# Patient Record
Sex: Male | Born: 1980 | Race: White | Hispanic: Yes | Marital: Married | Smoking: Never smoker
Health system: Southern US, Community
[De-identification: ages and names within clinical notes are randomized; demographics above are authoritative.]

---

## 2007-03-11 ENCOUNTER — Emergency Department (HOSPITAL_COMMUNITY): Admission: EM | Admit: 2007-03-11 | Discharge: 2007-03-11 | Payer: Self-pay | Admitting: Emergency Medicine

## 2009-12-23 ENCOUNTER — Ambulatory Visit: Payer: Self-pay | Admitting: Professional

## 2010-01-06 ENCOUNTER — Ambulatory Visit: Payer: Self-pay | Admitting: Professional

## 2013-08-24 ENCOUNTER — Encounter (HOSPITAL_COMMUNITY): Payer: Self-pay | Admitting: Emergency Medicine

## 2013-08-24 ENCOUNTER — Emergency Department (HOSPITAL_COMMUNITY)
Admission: EM | Admit: 2013-08-24 | Discharge: 2013-08-24 | Disposition: A | Discharge status: Home / Self Care | Payer: 59 | Source: Home / Self Care | Attending: Family Medicine | Admitting: Family Medicine

## 2013-08-24 DIAGNOSIS — L255 Unspecified contact dermatitis due to plants, except food: Secondary | ICD-10-CM

## 2013-08-24 DIAGNOSIS — L237 Allergic contact dermatitis due to plants, except food: Secondary | ICD-10-CM

## 2013-08-24 MED ORDER — PREDNISONE 20 MG PO TABS: 40.0000 mg | ORAL_TABLET | Freq: Every day | ORAL | Status: AC

## 2013-08-24 MED ORDER — DIPHENHYDRAMINE HCL 50 MG/ML IJ SOLN
25.0000 mg | Freq: Once | INTRAMUSCULAR | Status: AC
  Administered 2013-08-24: 25 mg via INTRAMUSCULAR

## 2013-08-24 MED ORDER — METHYLPREDNISOLONE SODIUM SUCC 125 MG IJ SOLR
INTRAMUSCULAR | Status: AC
  Filled 2013-08-24: qty 2

## 2013-08-24 MED ORDER — METHYLPREDNISOLONE ACETATE 80 MG/ML IJ SUSP
100.0000 mg | Freq: Once | INTRAMUSCULAR | Status: AC
  Administered 2013-08-24: 100 mg via INTRAMUSCULAR

## 2013-08-24 MED ORDER — DIPHENHYDRAMINE HCL 50 MG/ML IJ SOLN
INTRAMUSCULAR | Status: AC
  Filled 2013-08-24: qty 1

## 2013-08-24 NOTE — ED Provider Notes (Signed)
Medical screening examination/treatment/procedure(s) were performed by a resident physician or non-physician practitioner and as the supervising physician I was immediately available for consultation/collaboration.  Shawndell Varas, MD    Kden Wagster S Aztlan Coll, MD 08/24/13 1847 

## 2013-08-24 NOTE — ED Provider Notes (Signed)
CSN: [id number]     Arrival date & time 08/24/13  0932 History   First MD Initiated Contact with Patient 08/24/13 1032     Chief Complaint  Patient presents with  . Rash    Patient is a 33 y.o. male presenting with rash. The history is provided by the patient.  Rash Location:  Leg and shoulder/arm Shoulder/arm rash location:  L forearm and R forearm Leg rash location:  L lower leg, R lower leg, L ankle and R ankle Severity:  Severe Onset quality:  Gradual Duration:  2 days Timing:  Constant Progression:  Worsening Chronicity:  New Context: not animal contact, not chemical exposure, not exposure to similar rash, not food, not hot tub use, not insect bite/sting, not medications, not new detergent/soap, not nuts, not pollen, not pregnancy, not sick contacts and not sun exposure   Relieved by:  Nothing Worsened by:  Contact Ineffective treatments:  Anti-itch cream and antihistamines Pt reports 2 day h/o itchy rash to BLE's and bil ankles. Similar rash noted on bil forearms intermittently x 1 month but never got as bad as rash on bil LE's. 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 The itching keeps him up at night. No relief w/ OTC meds for rash and itching.   History reviewed. No pertinent past medical history. History reviewed. No pertinent past surgical history. History reviewed. No pertinent family history. History  Substance Use Topics  . Smoking status: Never Smoker   . Smokeless tobacco: Not on file  . Alcohol Use: No    Review of Systems  Skin: Positive for rash.  All other systems reviewed and are negative.   Allergies  Review of patient's allergies indicates no known allergies.  Home Medications   Prior to Admission medications   Medication Sig Start Date End Date Taking? Authorizing Provider  predniSONE (DELTASONE) 20 MG tablet Take 2 tablets (40 mg total) by mouth daily with breakfast. For 5 days. 08/24/13    Roma Kayser Benigna Delisi, NP   BP 139/95  Pulse 87  Temp(Src) 98.1 F (36.7 C) (Oral)  Resp 18  SpO2 97% Physical Exam  Constitutional: He is oriented to person, place, and time. He appears well-developed and well-nourished.  HENT:  Head: Normocephalic and atraumatic.  Eyes: Conjunctivae are normal.  Neck: Neck supple.  Cardiovascular: Normal rate.   Pulmonary/Chest: Effort normal.  Neurological: He is alert and oriented to person, place, and time.  Skin: Skin is warm and dry. Rash noted.     Psychiatric: He has a normal mood and affect.    ED Course  Procedures (including critical care time) Labs Review Labs Reviewed - No data to display  Imaging Review No results found.   MDM   1. Contact dermatitis due to poison ivy/oak    Solu-Medrol IM today Prednisone 40 mg PO x 5 days PRN Benadryl and OTC anti-itch cream if needed. Preventative measures discussed and provided in print.      Leanne Chang, NP 08/24/13 1125

## 2013-08-24 NOTE — ED Notes (Signed)
Generalized rash, worse ankles . Concern for scabies vs poison ivy

## 2013-08-24 NOTE — Discharge Instructions (Signed)
Take the Prednisone as directed. Benadryl 25-50 mg as needed for itching. Preventative measures as discussed.   Contact Dermatitis Contact dermatitis is a rash that happens when something touches the skin. You touched something that irritates your skin, or you have allergies to something you touched. HOME CARE   Avoid the thing that caused your rash.  Keep your rash away from hot water, soap, sunlight, chemicals, and other things that might bother it.  Do not scratch your rash.  You can take cool baths to help stop itching.  Only take medicine as told by your doctor.  Keep all doctor visits as told. GET HELP RIGHT AWAY IF:   Your rash is not better after 3 days.  Your rash gets worse.  Your rash is puffy (swollen), tender, red, sore, or warm.  You have problems with your medicine. MAKE SURE YOU:   Understand these instructions.  Will watch your condition.  Will get help right away if you are not doing well or get worse. Document Released: 01/01/2009 Document Revised: 05/29/2011 Document Reviewed: 08/09/2010 [facility] Patient Information 2014 [place], [place].  Poison [employer] ivy is a inflammation of the skin (contact dermatitis) caused by touching the allergens on the leaves of the ivy plant following previous exposure to the plant. The rash usually appears 48 hours after exposure. The rash is usually bumps (papules) or blisters (vesicles) in a linear pattern. Depending on your own sensitivity, the rash may simply cause redness and itching, or it may also progress to blisters which may break open. These must be well cared for to prevent secondary bacterial (germ) infection, followed by scarring. Keep any open areas dry, clean, dressed, and covered with an antibacterial ointment if needed. The eyes may also get puffy. The puffiness is worst in the morning and gets better as the day progresses. This dermatitis usually heals without scarring, within 2 to 3 weeks without  treatment. HOME CARE INSTRUCTIONS  Thoroughly wash with soap and water as soon as you have been exposed to poison ivy. You have about one half hour to remove the plant resin before it will cause the rash. This washing will destroy the oil or antigen on the skin that is causing, or will cause, the rash. Be sure to wash under your fingernails as any plant resin there will continue to spread the rash. Do not rub skin vigorously when washing affected area. Poison ivy cannot spread if no oil from the plant remains on your body. A rash that has progressed to weeping sores will not spread the rash unless you have not washed thoroughly. It is also important to wash any clothes you have been wearing as these may carry active allergens. The rash will return if you wear the unwashed clothing, even several days later. Avoidance of the plant in the future is the best measure. Poison ivy plant can be recognized by the number of leaves. Generally, poison ivy has three leaves with flowering branches on a single stem. Diphenhydramine may be purchased over the counter and used as needed for itching. Do not drive with this medication if it makes you drowsy.Ask your caregiver about medication for children. SEEK MEDICAL CARE IF:  Open sores develop.  Redness spreads beyond area of rash.  You notice purulent (pus-like) discharge.  You have increased pain.  Other signs of infection develop (such as fever). Document Released: 03/03/2000 Document Revised: 05/29/2011 Document Reviewed: 01/20/2009 [facility] Patient Information 2014 [place], [place].  Poison [facility] is an inflammation  of the skin (contact dermatitis). It is caused by contact with the allergens on the leaves of the oak (toxicodendron) plants. Depending on your sensitivity, the rash may consist simply of redness and itching, or it may also progress to blisters which may break open (rupture). These must be well cared for to prevent secondary germ  (bacterial) infection as these infections can lead to scarring. The eyes may also get puffy. The puffiness is worst in the morning and gets better as the day progresses. Healing is best accomplished by keeping any open areas dry, clean, covered with a bandage, and covered with an antibacterial ointment if needed. Without secondary infection, this dermatitis usually heals without scarring within 2 to 3 weeks without treatment. HOME CARE INSTRUCTIONS When you have been exposed to poison oak, it is very important to thoroughly wash with soap and water as soon as the exposure has been discovered. You have about one half hour to remove the plant resin before it will cause the rash. This cleaning will quickly destroy the oil or antigen on the skin (the antigen is what causes the rash). Wash aggressively under the fingernails as any plant resin still there will continue to spread the rash. Do not rub skin vigorously when washing affected area. Poison oak cannot spread if no oil from the plant remains on your body. Rash that has progressed to weeping sores (lesions) will not spread the rash unless you have not washed thoroughly. It is also important to clean any clothes you have been wearing as they may carry active allergens which will spread the rash, even several days later. Avoidance of the plant in the future is the best measure. Poison oak plants can be recognized by the number of leaves. Generally, poison oak has three leaves with flowering branches on a single stem. Diphenhydramine may be purchased over the counter and used as needed for itching. Do not drive with this medication if it makes you drowsy. Ask your caregiver about medication for children. SEEK IMMEDIATE MEDICAL CARE IF:   Open areas of the rash develop.  You notice redness extending beyond the area of the rash.  There is a pus like discharge.  There is increased pain.  Other signs of infection develop (such as fever). Document Released:  09/10/2002 Document Revised: 05/29/2011 Document Reviewed: 01/20/2009 [facility] Patient Information 2014 [place].

## 2013-08-25 ENCOUNTER — Telehealth (HOSPITAL_COMMUNITY): Payer: Self-pay | Admitting: Emergency Medicine

## 2013-08-25 NOTE — ED Notes (Signed)
Pt. called and said he lost his Rx. Prednisone.  I asked Langston Masker PA if I could call it in for him.  She approved this.  I called Rx. to pharmacist @ [place] on [address] @ [phone]. Desiree Lucy [facility] 08/25/2013

## 2014-05-28 ENCOUNTER — Emergency Department (INDEPENDENT_AMBULATORY_CARE_PROVIDER_SITE_OTHER): Payer: Commercial Managed Care - PPO

## 2014-05-28 ENCOUNTER — Emergency Department (HOSPITAL_COMMUNITY)
Admission: EM | Admit: 2014-05-28 | Discharge: 2014-05-28 | Disposition: A | Discharge status: Home / Self Care | Payer: Commercial Managed Care - PPO | Source: Home / Self Care | Attending: Family Medicine | Admitting: Family Medicine

## 2014-05-28 ENCOUNTER — Encounter (HOSPITAL_COMMUNITY): Payer: Self-pay | Admitting: Emergency Medicine

## 2014-05-28 DIAGNOSIS — R69 Illness, unspecified: Principal | ICD-10-CM

## 2014-05-28 DIAGNOSIS — J111 Influenza due to unidentified influenza virus with other respiratory manifestations: Secondary | ICD-10-CM

## 2014-05-28 MED ORDER — OSELTAMIVIR PHOSPHATE 75 MG PO CAPS: 75.0000 mg | ORAL_CAPSULE | Freq: Two times a day (BID) | ORAL | Status: AC

## 2014-05-28 MED ORDER — HYDROCOD POLST-CHLORPHEN POLST 10-8 MG/5ML PO LQCR: 5.0000 mL | Freq: Two times a day (BID) | ORAL | Status: AC | PRN

## 2014-05-28 NOTE — ED Provider Notes (Signed)
CSN: [id number]     Arrival date & time 05/28/14  1014 History   First MD Initiated Contact with Patient 05/28/14 1105     Chief Complaint  Patient presents with  . Bronchitis   (Consider location/radiation/quality/duration/timing/severity/associated sxs/prior Treatment) HPI       34 year old male presents complaining of bronchitis. Yesterday he had acute onset of cough, fever, body aches, chest pain with coughing, mild shortness of breath. Symptoms have worsened slightly today. He is taking the counter medication with minimal relief of his symptoms. No recent travel or sick contacts. He did not get a flu vaccine.  History reviewed. No pertinent past medical history. History reviewed. No pertinent past surgical history. No family history on file. History  Substance Use Topics  . Smoking status: Never Smoker   . Smokeless tobacco: Not on file  . Alcohol Use: No    Review of Systems  Constitutional: Positive for fever and chills.  HENT: Positive for congestion. Negative for ear pain, sinus pressure and sore throat.   Respiratory: Positive for shortness of breath.   Cardiovascular: Positive for chest pain.  Gastrointestinal: Negative for nausea, vomiting and diarrhea.  Musculoskeletal: Positive for myalgias.  Skin: Negative for rash.  Neurological: Positive for headaches.  All other systems reviewed and are negative.   Allergies  Review of patient's allergies indicates no known allergies.  Home Medications   Prior to Admission medications   Medication Sig Start Date End Date Taking? Authorizing Provider  chlorpheniramine-HYDROcodone (TUSSIONEX PENNKINETIC ER) 10-8 MG/5ML LQCR Take 5 mLs by mouth every 12 (twelve) hours as needed for cough. 05/28/14   Graylon GoodZachary H Tinia Oravec, PA-C  oseltamivir (TAMIFLU) 75 MG capsule Take 1 capsule (75 mg total) by mouth every 12 (twelve) hours. 05/28/14   Adrian BlackwaterZachary H Diogo Anne, PA-C  predniSONE (DELTASONE) 20 MG tablet Take 2 tablets (40 mg total) by mouth  daily with breakfast. For 5 days. 08/24/13   Roma KayserKatherine P Schorr, NP   BP 126/87 mmHg  Pulse 118  Temp(Src) 101.4 F (38.6 C) (Oral)  Resp 20  SpO2 94% Physical Exam  Constitutional: He is oriented to person, place, and time. He appears well-developed and well-nourished. No distress.  HENT:  Head: Normocephalic and atraumatic.  Right Ear: External ear normal.  Left Ear: External ear normal.  Nose: Nose normal.  Mouth/Throat: Oropharynx is clear and moist. No oropharyngeal exudate.  Eyes: Conjunctivae are normal.  Neck: Normal range of motion. Neck supple.  Cardiovascular: Normal rate, regular rhythm and normal heart sounds.   Pulmonary/Chest: Effort normal and breath sounds normal. No respiratory distress.  Lymphadenopathy:    He has no cervical adenopathy.  Neurological: He is alert and oriented to person, place, and time. Coordination normal.  Skin: Skin is warm and dry. No rash noted. He is not diaphoretic.  Psychiatric: He has a normal mood and affect. Judgment normal.  Nursing note and vitals reviewed.   ED Course  Procedures (including critical care time) Labs Review Labs Reviewed - No data to display  Imaging Review Dg Chest 2 View  05/28/2014   CLINICAL DATA:  Bronchitis. Fever cough and sore throat. Shortness of breath and headache.  EXAM: CHEST  2 VIEW  COMPARISON:  None.  FINDINGS: The heart size and mediastinal contours are within normal limits. Both lungs are clear. The visualized skeletal structures are unremarkable.  IMPRESSION: No active cardiopulmonary disease.   Electronically Signed   By: Signa Kellaylor  Stroud M.D.   On: 05/28/2014 12:38     MDM  1. Influenza-like illness    With acute onset of fever and respiratory illness, most likely influenza. Is very concerned about possibility of pneumonia so we'll get chest x-ray to rule out  History chest x-rays normal. We will treat for influenza. Follow-up when necessary, return precautions discussed   Meds ordered  this encounter  Medications  . oseltamivir (TAMIFLU) 75 MG capsule    Sig: Take 1 capsule (75 mg total) by mouth every 12 (twelve) hours.    Dispense:  10 capsule    Refill:  0    Order Specific Question:  Supervising Provider    Answer:  Rodolph Bong [id number]  . chlorpheniramine-HYDROcodone (TUSSIONEX PENNKINETIC ER) 10-8 MG/5ML LQCR    Sig: Take 5 mLs by mouth every 12 (twelve) hours as needed for cough.    Dispense:  115 mL    Refill:  0    Order Specific Question:  Supervising Provider    Answer:  Rodolph Bong [[id number]]     Graylon Good, PA-C 05/28/14 1246

## 2014-05-28 NOTE — Discharge Instructions (Signed)

## 2014-05-28 NOTE — ED Notes (Addendum)
Patient reports history of bronchitis.  patient thinks this is another episode of bronchitis.  Symptoms started yesterday: frequent cough, non-productive cough.  Head and chest hurt with coughing, unknown fever, but has had sweats and chills.

## 2014-05-31 ENCOUNTER — Telehealth (HOSPITAL_COMMUNITY): Payer: Self-pay | Admitting: *Deleted

## 2014-05-31 NOTE — ED Notes (Signed)
Pt. called and said he was not given his Z-pack.  I accessed his chart and said he was prescribed Tamiflu for a viral infection.  Pt. has not filled it. He said he gets bronchitis every year and always gets a z-pack. I explained that bronchitis can be viral as well and don't always require an antibiotic.  Discussed with Dr. Konrad DoloresMerrell and he agreed.  He said pt. would have to come back and be rechecked. I told pt. this.  He asked if he would have to pay another co-pay and I said yes.  He hung up before I could say anything else. Vassie MoselleYork, Asti Mackley M 05/31/2014

## 2015-07-28 IMAGING — DX DG CHEST 2V
2 series · 2 of 2 positions shown · non-contrast
Comparison: None.

CLINICAL DATA: Bronchitis. Fever cough and sore throat. Shortness
of breath and headache.

EXAM:
CHEST  2 VIEW

[chest pa]
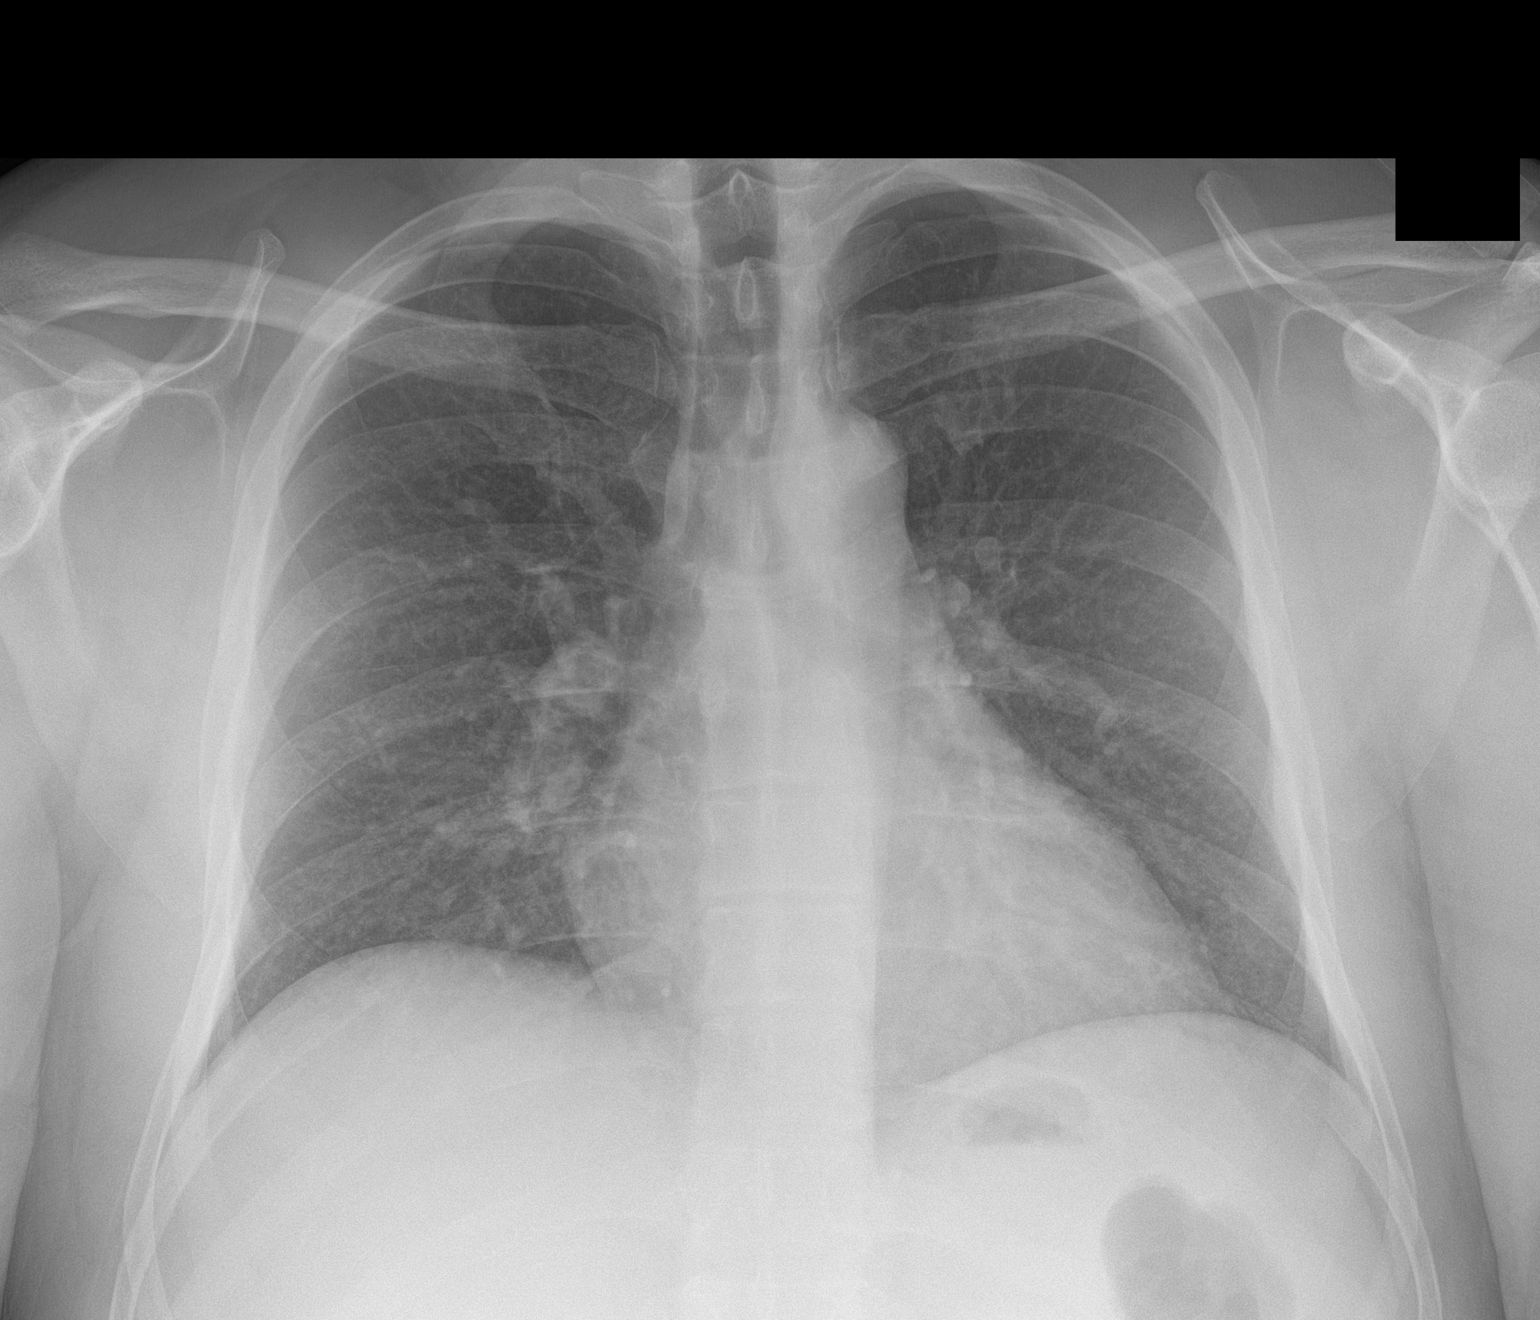

[chest lat]
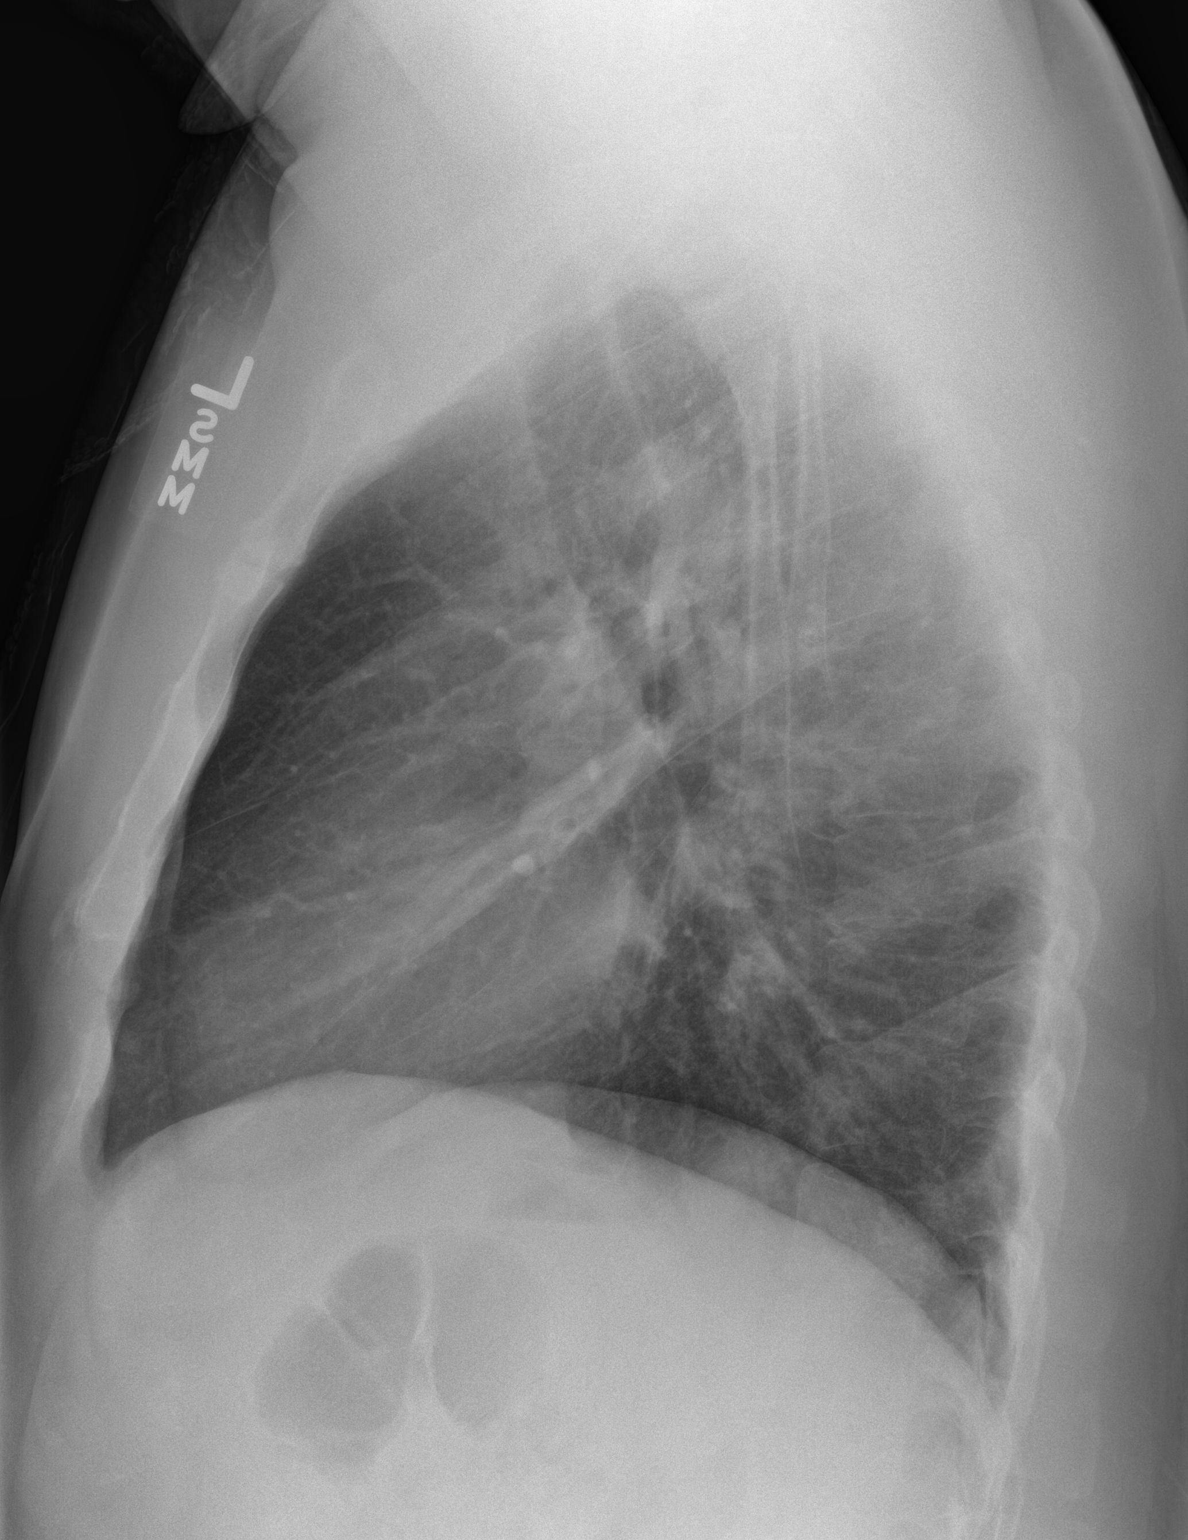

[2 of 2 positions shown; findings below may reference images not displayed]

FINDINGS: The heart size and mediastinal contours are within normal limits.
Both lungs are clear. The visualized skeletal structures are
unremarkable.
IMPRESSION: No active cardiopulmonary disease.

## 2020-04-12 DIAGNOSIS — J9801 Acute bronchospasm: Secondary | ICD-10-CM | POA: Diagnosis not present

## 2020-04-12 DIAGNOSIS — J209 Acute bronchitis, unspecified: Secondary | ICD-10-CM | POA: Diagnosis not present

## 2020-05-05 DIAGNOSIS — N46023 Azoospermia due to obstruction of efferent ducts: Secondary | ICD-10-CM | POA: Diagnosis not present
